# Patient Record
Sex: Male | Born: 2000 | Race: White | Hispanic: Yes | Marital: Single | Smoking: Never smoker
Health system: Southern US, Community
[De-identification: ages and names within clinical notes are randomized; demographics above are authoritative.]

## PROBLEM LIST (undated history)

## (undated) DIAGNOSIS — F419 Anxiety disorder, unspecified: Secondary | ICD-10-CM

## (undated) DIAGNOSIS — L309 Dermatitis, unspecified: Secondary | ICD-10-CM

## (undated) DIAGNOSIS — J45909 Unspecified asthma, uncomplicated: Secondary | ICD-10-CM

## (undated) HISTORY — DX: Anxiety disorder, unspecified: F41.9

## (undated) HISTORY — DX: Dermatitis, unspecified: L30.9

## (undated) HISTORY — DX: Unspecified asthma, uncomplicated: J45.909

---
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
## 2018-02-24 DIAGNOSIS — M545 Low back pain: Secondary | ICD-10-CM | POA: Diagnosis not present

## 2018-07-03 ENCOUNTER — Telehealth (INDEPENDENT_AMBULATORY_CARE_PROVIDER_SITE_OTHER): Payer: BLUE CROSS/BLUE SHIELD | Admitting: Family

## 2018-07-03 ENCOUNTER — Ambulatory Visit: Payer: Self-pay | Admitting: *Deleted

## 2018-07-03 DIAGNOSIS — J45909 Unspecified asthma, uncomplicated: Secondary | ICD-10-CM

## 2018-07-03 MED ORDER — FLUTICASONE PROPIONATE HFA 110 MCG/ACT IN AERO: 1.0000 | INHALATION_SPRAY | Freq: Two times a day (BID) | RESPIRATORY_TRACT | 3 refills | Status: AC

## 2018-07-03 MED ORDER — ALBUTEROL SULFATE HFA 108 (90 BASE) MCG/ACT IN AERS: 2.0000 | INHALATION_SPRAY | RESPIRATORY_TRACT | 3 refills | Status: AC | PRN

## 2018-07-03 NOTE — Progress Notes (Signed)
Virtual Visit via Video Note  I connected with Cleatis Clemenbt on 07/03/18 at 11:40 AM EDT by a video enabled telemedicine application and verified that I am speaking with the correct person using two identifiers. This visit type was conducted due to national recommendations for restrictions regarding the COVID-19 Pandemic (e.g. social distancing).  This format is felt to be most appropriate for this patient at this time.   I discussed the limitations of evaluation and management by telemedicine and the availability of in person appointments. The patient expressed understanding and agreed to proceed.  Only the patient and myself were on today's video visit. The patient was at home and I was in my office at the time of today's visit.   History of Present Illness:  Patient is a 18 yr old male who presents today with c/o it being "more difficult for me to breath."  Reports that like "I can breath but that I can't get enough oxygen." Felt spaced out.  Reports that symptoms are worst at night and have been present x 1 week.  Reports that he has also been having some intermittent sharp pains that radiate across his chest bilaterally. Denies fever or cough.  He reports mild nasal congestion.    Observations/Objective:  Gen: Awake, alert, no acute distress Resp: Breathing is even and non-labored Psych: calm/pleasant demeanor Neuro: Alert and Oriented x 3, + facial symmetry, speech is clear.   Assessment and Plan:  Asthma- uncontrolled. Will refill albuterol and add flovent bid. He is advised to continue flovent through the spring until pollen settles down and asthma symptoms are stable.   Follow Up Instructions:     I discussed the assessment and treatment plan with the patient. The patient was provided an opportunity to ask questions and all were answered. The patient agreed with the plan and demonstrated an understanding of the instructions.   The patient was advised to call back or seek an  in-person evaluation if the symptoms worsen or if the condition fails to improve as anticipated.    Lemont Fillers, NP

## 2018-07-03 NOTE — Telephone Encounter (Signed)
Appointment scheduled with M. Peggyann Juba. Per mother they would like to see male provider.

## 2018-07-03 NOTE — Telephone Encounter (Signed)
Patient is having exacerbation of asthma. Patient is requesting appointment- increased wheezing and needs inhaler. Mother is calling to report her son is having increased episodes of asthma without exercise.   Reason for Disposition . [1] MILD asthma attack (e.g., no SOB at rest, mild SOB with walking, speaks normally in sentences, mild wheezing) AND [2]  persists > 24 hours on appropriate treatment  Answer Assessment - Initial Assessment Questions 1. RESPIRATORY STATUS: "Describe your breathing?" (e.g., wheezing, shortness of breath, unable to speak, severe coughing)      Breath is shallow at times with wheezing. Patient has not been using inhaler with exercise- but now is needing it. Patient seems to be needing the inhaler at times other than exercise. Patient did have tingling of extermities 2. ONSET: "When did this asthma attack begin?"      1 week ago- breathing issues increased 3. TRIGGER: "What do you think triggered this attack?" (e.g., URI, exposure to pollen or other allergen, tobacco smoke)      Possible pollen- no fever or cough 4. PEAK EXPIRATORY FLOW RATE (PEFR): "Do you use a peak flow meter?" If so, ask: "What's the current peak flow? What's your personal best peak flow?"      no 5. SEVERITY: "How bad is this attack?"    - MILD: No SOB at rest, mild SOB with walking, speaks normally in sentences, can lay down, no retractions, pulse < 100. (GREEN Zone: PEFR 80-100%)   - MODERATE: SOB at rest, SOB with minimal exertion and prefers to sit, cannot lie down flat, speaks in phrases, mild retractions, audible wheezing, pulse 100-120. (YELLOW Zone: PEFR 50-80%)    - SEVERE: Very SOB at rest, speaks in single words, struggling to breathe, sitting hunched forward, retractions, usually loud wheezing, sometimes minimal wheezing because of decreased air movement, pulse > 120. (RED Zone: PEFR < 50%).      Symptoms had always been mild- these symptoms have comes on suddenly within the past  week 6. MEDICATIONS (Inhaler or nebs): "What are your asthma medications?" and "What treatments have you given so far?"    - Quick-relief: albuterol, metaproterenol, salbutamol, or other inhaled or nebulized beta-agonist medicines   - Long-term-control: steroids, cromolyn, or other anti-inflammatory medicines.     Patient does not have inhaler 7. OTHER SYMPTOMS: "Do you have any other symptoms? (e.g., runny nose, chest pain, fever)     no 8. PREGNANCY: "Is there any chance you are pregnant?" "When was your last menstrual period?"     n/a  Protocols used: ASTHMA ATTACK-A-AH

## 2018-09-19 ENCOUNTER — Telehealth: Payer: Self-pay

## 2018-09-19 ENCOUNTER — Ambulatory Visit: Payer: Self-pay | Admitting: Medical

## 2018-09-19 NOTE — Telephone Encounter (Signed)
Pt's mother called wanting pt to get covid testing due to exposure. Requested mother to tell pt to call back to be triaged. Mother verbalized understanding

## 2018-09-19 NOTE — Telephone Encounter (Signed)
Pt. Reports he was around his cousin whose parents tested positive for Covid 28.Pt. does not have symptoms. Per Marita [place] in the practice, pt. Can utilize community testing. Pt. Verbalizes understanding. Answer Assessment - Initial Assessment Questions 1. CLOSE CONTACT: "Who is the person with the confirmed or suspected COVID-19 infection that you were exposed to?"     No test 2. PLACE of CONTACT: "Where were you when you were exposed to COVID-19?" (e.g., home, school, medical waiting room; which city?)     Home 3. TYPE of CONTACT: "How much contact was there?" (e.g., sitting next to, live in same house, work in same office, same building)     Home 4. DURATION of CONTACT: "How long were you in contact with the COVID-19 patient?" (e.g., a few seconds, passed by person, a few minutes, live with the patient)     A few hours 5. DATE of CONTACT: "When did you have contact with a COVID-19 patient?" (e.g., how many days ago)     Last week 6. TRAVEL: "Have you traveled out of the country recently?" If so, "When and where?"     * Also ask about out-of-state travel, since the CDC has identified some high-risk cities for community spread in the [place].     * Note: Travel becomes less relevant if there is widespread community transmission where the patient lives.     No 7. COMMUNITY SPREAD: "Are there lots of cases of COVID-19 (community spread) where you live?" (See public health department website, if unsure)       Yes 8. SYMPTOMS: "Do you have any symptoms?" (e.g., fever, cough, breathing difficulty)     No 9. PREGNANCY OR POSTPARTUM: "Is there any chance you are pregnant?" "When was your last menstrual period?" "Did you deliver in the last 2 weeks?"     n/a 10. HIGH RISK: "Do you have any heart or lung problems? Do you have a weak immune system?" (e.g., CHF, COPD, asthma, HIV positive, chemotherapy, renal failure, diabetes mellitus, sickle cell anemia)       Exercise induced asthma  Protocols used:  CORONAVIRUS (COVID-19) EXPOSURE-A-AH

## 2018-09-21 DIAGNOSIS — F331 Major depressive disorder, recurrent, moderate: Secondary | ICD-10-CM | POA: Diagnosis not present

## 2018-09-21 DIAGNOSIS — F441 Dissociative fugue: Secondary | ICD-10-CM | POA: Diagnosis not present

## 2018-10-05 DIAGNOSIS — F331 Major depressive disorder, recurrent, moderate: Secondary | ICD-10-CM | POA: Diagnosis not present

## 2018-10-26 ENCOUNTER — Encounter: Payer: Self-pay | Admitting: Family Medicine

## 2018-10-26 ENCOUNTER — Other Ambulatory Visit: Payer: Self-pay

## 2018-10-26 ENCOUNTER — Ambulatory Visit: Payer: BC Managed Care – PPO | Admitting: Family Medicine

## 2018-10-26 ENCOUNTER — Other Ambulatory Visit (HOSPITAL_COMMUNITY)
Admission: RE | Admit: 2018-10-26 | Discharge: 2018-10-26 | Disposition: A | Discharge status: Home / Self Care | Payer: BC Managed Care – PPO | Source: Ambulatory Visit | Attending: Family Medicine | Admitting: Family Medicine

## 2018-10-26 VITALS — BP 120/60 | HR 95 | Temp 98.4°F | Resp 18 | Ht 74.0 in | Wt 164.4 lb

## 2018-10-26 DIAGNOSIS — Z7251 High risk heterosexual behavior: Secondary | ICD-10-CM

## 2018-10-26 DIAGNOSIS — R369 Urethral discharge, unspecified: Secondary | ICD-10-CM

## 2018-10-26 LAB — POC URINALSYSI DIPSTICK (AUTOMATED)
Bilirubin, UA: NEGATIVE
Blood, UA: NEGATIVE
Glucose, UA: NEGATIVE
Ketones, UA: NEGATIVE
Leukocytes, UA: NEGATIVE
Nitrite, UA: NEGATIVE
Protein, UA: NEGATIVE
Spec Grav, UA: 1.025 (ref 1.010–1.025)
Urobilinogen, UA: 0.2 E.U./dL
pH, UA: 6 (ref 5.0–8.0)

## 2018-10-26 MED ORDER — AZITHROMYCIN 250 MG PO TABS: ORAL_TABLET | ORAL | 0 refills | Status: DC

## 2018-10-26 MED ORDER — CEFTRIAXONE SODIUM 250 MG IJ SOLR
250.0000 mg | Freq: Once | INTRAMUSCULAR | Status: AC
  Administered 2018-10-26: 250 mg via INTRAMUSCULAR

## 2018-10-26 MED FILL — AZITHROMYCIN 250 MG TABS: 250 | 4 days supply | Qty: 4 | Fill #0

## 2018-10-26 NOTE — Patient Instructions (Signed)

## 2018-10-26 NOTE — Progress Notes (Signed)
Patient ID: William Melendez, male    DOB: 2001/01/10  Age: 18 y.o. MRN: [id number]    Subjective:  Subjective  HPI William Melendez presents for tenderness in testicles and penile d/c --- yellow x 1 week.  He has been having unprotected sex and is concerned.  No fevers no abd pain   Review of Systems  Constitutional: Negative for appetite change, diaphoresis, fatigue and unexpected weight change.  Eyes: Negative for pain, redness and visual disturbance.  Respiratory: Negative for cough, chest tightness, shortness of breath and wheezing.   Cardiovascular: Negative for chest pain, palpitations and leg swelling.  Endocrine: Negative for cold intolerance, heat intolerance, polydipsia, polyphagia and polyuria.  Genitourinary: Positive for discharge and testicular pain. Negative for difficulty urinating, dysuria, frequency and urgency.  Neurological: Negative for dizziness, light-headedness, numbness and headaches.    History Past Medical History:  Diagnosis Date  . Anxiety   . Asthma   . Eczema     He has no past surgical history on file.   His family history includes Arrhythmia in his maternal grandfather; Heart attack in his paternal grandmother and paternal uncle; Stroke in his mother.He reports that he has never smoked. He has never used smokeless tobacco. He reports that he does not drink alcohol or use drugs.  Current Outpatient Medications on File Prior to Visit  Medication Sig Dispense Refill  . albuterol (PROAIR HFA) 108 (90 Base) MCG/ACT inhaler Inhale 2 puffs into the lungs every 4 (four) hours as needed for wheezing or shortness of breath. 1 Inhaler 3  . cetirizine (ZYRTEC) 10 MG tablet Take 10 mg by mouth daily.    . flunisolide (NASALIDE) 25 MCG/ACT (0.025%) SOLN Place 1 spray into the nose 2 (two) times daily.    . fluticasone (FLOVENT HFA) 110 MCG/ACT inhaler Inhale 1 puff into the lungs 2 (two) times a day. 1 Inhaler 3  . triamcinolone (KENALOG) 0.025 % ointment Apply 1  application topically 2 (two) times daily.     No current facility-administered medications on file prior to visit.      Objective:  Objective  Physical Exam Vitals signs and nursing note reviewed.  Constitutional:      General: He is sleeping.     Appearance: He is well-developed.  HENT:     Head: Normocephalic and atraumatic.  Eyes:     Pupils: Pupils are equal, round, and reactive to light.  Neck:     Musculoskeletal: Normal range of motion and neck supple.     Thyroid: No thyromegaly.  Cardiovascular:     Rate and Rhythm: Normal rate and regular rhythm.     Heart sounds: No murmur.  Pulmonary:     Effort: Pulmonary effort is normal. No respiratory distress.     Breath sounds: Normal breath sounds. No wheezing or rales.  Chest:     Chest wall: No tenderness.  Abdominal:     General: There is no distension.     Palpations: Abdomen is soft. There is no mass.     Tenderness: There is no abdominal tenderness. There is no right CVA tenderness, left CVA tenderness, guarding or rebound.     Hernia: No hernia is present.  Genitourinary:    Penis: No erythema, discharge or lesions.      Scrotum/Testes: Normal.        Right: Mass or tenderness not present.        Left: Mass or tenderness not present.  Musculoskeletal:  General: No tenderness.  Skin:    General: Skin is warm and dry.  Neurological:     Mental Status: He is oriented to person, place, and time.  Psychiatric:        Behavior: Behavior normal.        Thought Content: Thought content normal.        Judgment: Judgment normal.    BP 120/60 (BP Location: Right Arm, Patient Position: Sitting, Cuff Size: Normal)   Pulse 95   Temp 98.4 F (36.9 C) (Temporal)   Resp 18   Ht 6\' 2"  (1.88 m)   Wt 164 lb 6.4 oz (74.6 kg)   SpO2 98%   BMI 21.11 kg/m  Wt Readings from Last 3 Encounters:  10/26/18 164 lb 6.4 oz (74.6 kg) (64 %, Z= 0.35)*  09/15/17 167 lb (75.8 kg) (72 %, Z= 0.59)*  02/24/17 170 lb 6.4 oz  (77.3 kg) (78 %, Z= 0.78)*   * Growth percentiles are based on CDC (Boys, 2-20 Years) data.     Lab Results  Component Value Date   WBC 7.5 01/06/2017   HGB 15.8 01/06/2017   HCT 45.2 01/06/2017   PLT 251.0 01/06/2017   GLUCOSE 89 01/06/2017   ALT 19 01/06/2017   AST 29 01/06/2017   NA 140 01/06/2017   K 4.0 01/06/2017   CL 102 01/06/2017   CREATININE 1.19 01/06/2017   BUN 18 01/06/2017   CO2 31 01/06/2017   TSH 2.14 01/06/2017    No results found.   Assessment & Plan:  Plan  I am having William Melendez start on azithromycin. I am also having him maintain his cetirizine, flunisolide, triamcinolone, albuterol, and fluticasone. We administered cefTRIAXone.  Meds ordered this encounter  Medications  . azithromycin (ZITHROMAX) 250 MG tablet    Sig: 4 po qd x1    Dispense:  4 tablet    Refill:  0  . cefTRIAXone (ROCEPHIN) injection 250 mg    Problem List Items Addressed This Visit    None    Visit Diagnoses    High risk sexual behavior, unspecified type    -  Primary   Relevant Medications   azithromycin (ZITHROMAX) 250 MG tablet   cefTRIAXone (ROCEPHIN) injection 250 mg (Completed)   Other Relevant Orders   Urine cytology ancillary only([facility]) (Completed)   HIV antibody (with reflex) (Completed)   RPR (Completed)   HSV Type I/II IgG, IgMw/ reflex (Completed)   Penile discharge       Relevant Medications   azithromycin (ZITHROMAX) 250 MG tablet   Other Relevant Orders   POCT Urinalysis Dipstick (Automated) (Completed)      Follow-up: Return if symptoms worsen or fail to improve.  Donato SchultzYvonne R Lowne Chase, DO

## 2018-10-27 ENCOUNTER — Encounter: Payer: Self-pay | Admitting: Family Medicine

## 2018-10-27 LAB — HSV TYPE I/II IGG, IGMW/ REFLEX
HSV 1 Glycoprotein G Ab, IgG: <0.91 index (ref 0.00–0.90)
HSV 1 IgM: <1:10 {titer}
HSV 2 IgG, Type Spec: <0.91 index (ref 0.00–0.90)
HSV 2 IgM: <1:10 {titer}

## 2018-10-27 LAB — RPR: RPR Ser Ql: NONREACTIVE

## 2018-10-27 LAB — HIV ANTIBODY (ROUTINE TESTING W REFLEX): HIV 1&2 Ab, 4th Generation: NONREACTIVE

## 2018-10-28 LAB — URINE CYTOLOGY ANCILLARY ONLY
Chlamydia: NEGATIVE
Neisseria Gonorrhea: NEGATIVE
Trichomonas: NEGATIVE

## 2018-10-30 LAB — URINE CYTOLOGY ANCILLARY ONLY
Bacterial vaginitis: NEGATIVE
Candida vaginitis: NEGATIVE

## 2019-09-21 ENCOUNTER — Encounter: Payer: Self-pay | Admitting: Internal Medicine

## 2019-09-21 ENCOUNTER — Encounter (HOSPITAL_BASED_OUTPATIENT_CLINIC_OR_DEPARTMENT_OTHER): Payer: Self-pay | Admitting: *Deleted

## 2019-09-21 ENCOUNTER — Other Ambulatory Visit: Payer: Self-pay

## 2019-09-21 ENCOUNTER — Ambulatory Visit: Payer: No Typology Code available for payment source | Admitting: Internal Medicine

## 2019-09-21 ENCOUNTER — Emergency Department (HOSPITAL_BASED_OUTPATIENT_CLINIC_OR_DEPARTMENT_OTHER)
Admission: EM | Admit: 2019-09-21 | Discharge: 2019-09-22 | Disposition: A | Discharge status: Home / Self Care | Payer: PRIVATE HEALTH INSURANCE | Attending: Emergency Medicine | Admitting: Emergency Medicine

## 2019-09-21 VITALS — BP 152/90 | HR 66 | Temp 98.0°F | Ht 72.9 in | Wt 178.0 lb

## 2019-09-21 DIAGNOSIS — J45909 Unspecified asthma, uncomplicated: Secondary | ICD-10-CM | POA: Insufficient documentation

## 2019-09-21 DIAGNOSIS — Z7951 Long term (current) use of inhaled steroids: Secondary | ICD-10-CM | POA: Diagnosis not present

## 2019-09-21 DIAGNOSIS — M79604 Pain in right leg: Secondary | ICD-10-CM

## 2019-09-21 LAB — COMPREHENSIVE METABOLIC PANEL
ALT: 36 U/L (ref 0–44)
AST: 59 U/L — ABNORMAL HIGH (ref 15–41)
Albumin: 4.9 g/dL (ref 3.5–5.0)
Alkaline Phosphatase: 60 U/L (ref 38–126)
Anion gap: 10 (ref 5–15)
BUN: 23 mg/dL — ABNORMAL HIGH (ref 6–20)
CO2: 26 mmol/L (ref 22–32)
Calcium: 9.1 mg/dL (ref 8.9–10.3)
Chloride: 102 mmol/L (ref 98–111)
Creatinine, Ser: 1.11 mg/dL (ref 0.61–1.24)
GFR calc Af Amer: >60 mL/min (ref >60)
GFR calc non Af Amer: >60 mL/min (ref >60)
Glucose, Bld: 107 mg/dL — ABNORMAL HIGH (ref 70–99)
Potassium: 4.2 mmol/L (ref 3.5–5.1)
Sodium: 138 mmol/L (ref 135–145)
Total Bilirubin: 1.2 mg/dL (ref 0.3–1.2)
Total Protein: 7.4 g/dL (ref 6.5–8.1)

## 2019-09-21 LAB — CBC WITH DIFFERENTIAL/PLATELET
Abs Immature Granulocytes: 0.03 10*3/uL (ref 0.00–0.07)
Basophils Absolute: 0.1 10*3/uL (ref 0.0–0.1)
Basophils Relative: 1 %
Eosinophils Absolute: 0.3 10*3/uL (ref 0.0–0.5)
Eosinophils Relative: 3 %
HCT: 44.4 % (ref 39.0–52.0)
Hemoglobin: 15.1 g/dL (ref 13.0–17.0)
Immature Granulocytes: 0 %
Lymphocytes Relative: 18 %
Lymphs Abs: 1.8 10*3/uL (ref 0.7–4.0)
MCH: 31.5 pg (ref 26.0–34.0)
MCHC: 34 g/dL (ref 30.0–36.0)
MCV: 92.5 fL (ref 80.0–100.0)
Monocytes Absolute: 0.8 10*3/uL (ref 0.1–1.0)
Monocytes Relative: 7 %
Neutro Abs: 7.5 10*3/uL (ref 1.7–7.7)
Neutrophils Relative %: 71 %
Platelets: 234 10*3/uL (ref 150–400)
RBC: 4.8 MIL/uL (ref 4.22–5.81)
RDW: 11.5 % (ref 11.5–15.5)
WBC: 10.5 10*3/uL (ref 4.0–10.5)
nRBC: 0 % (ref 0.0–0.2)

## 2019-09-21 LAB — URINALYSIS, ROUTINE W REFLEX MICROSCOPIC
Bilirubin Urine: NEGATIVE
Glucose, UA: NEGATIVE mg/dL
Hgb urine dipstick: NEGATIVE
Ketones, ur: NEGATIVE mg/dL
Leukocytes,Ua: NEGATIVE
Nitrite: NEGATIVE
Protein, ur: NEGATIVE mg/dL
Specific Gravity, Urine: 1.01 (ref 1.005–1.030)
pH: 6 (ref 5.0–8.0)

## 2019-09-21 NOTE — ED Triage Notes (Signed)
Pt c/o right groin pain which radiates down to right knee x 1 day , seen by PMD for same DX muscle strain

## 2019-09-21 NOTE — Progress Notes (Signed)
° °  Subjective:   Patient ID: William Melendez, male    DOB: 2000/05/25, 19 y.o.   MRN: [id number]  HPI The patient is a 19 YO man coming in for leg pain. Started around 4 am and initially he thought is was a muscle spasm or cramp. However stretching the leg did not help. Was in the right thigh region. Did play basketball this week and do leg workup but this is not outside of normal. No fouls, twists, falls, injuries during basketball. Having no pain now. Did take ibuprofen 2 pills which seemed to help the pain. Gradually it resolved and he was able to go back to sleep. Denies swelling, rash. Denies fevers or chills. Is fully vaccinated against covid-19. Overall it is improving.   Review of Systems  Constitutional: Negative.   HENT: Negative.   Eyes: Negative.   Respiratory: Negative for cough, chest tightness and shortness of breath.   Cardiovascular: Negative for chest pain, palpitations and leg swelling.  Gastrointestinal: Negative for abdominal distention, abdominal pain, constipation, diarrhea, nausea and vomiting.  Musculoskeletal: Positive for myalgias.  Skin: Negative.   Neurological: Negative.   Psychiatric/Behavioral: Negative.     Objective:  Physical Exam Constitutional:      Appearance: He is well-developed.  HENT:     Head: Normocephalic and atraumatic.  Cardiovascular:     Rate and Rhythm: Normal rate and regular rhythm.  Pulmonary:     Effort: Pulmonary effort is normal. No respiratory distress.     Breath sounds: Normal breath sounds. No wheezing or rales.  Abdominal:     General: Bowel sounds are normal. There is no distension.     Palpations: Abdomen is soft.     Tenderness: There is no abdominal tenderness. There is no rebound.  Musculoskeletal:     Cervical back: Normal range of motion.  Skin:    General: Skin is warm and dry.  Neurological:     Mental Status: He is alert and oriented to person, place, and time.     Coordination: Coordination normal.      Vitals:   09/21/19 1156  BP: (!) 152/90  Pulse: 66  Temp: 98 F (36.7 C)  TempSrc: Oral  SpO2: 98%  Weight: 178 lb (80.7 kg)  Height: 6' 0.9" (1.852 m)    This visit occurred during the SARS-CoV-2 public health emergency.  Safety protocols were in place, including screening questions prior to the visit, additional usage of staff PPE, and extensive cleaning of exam room while observing appropriate contact time as indicated for disinfecting solutions.   Assessment & Plan:  Visit time 15 minutes in face to face communication with patient and coordination of care, additional 5 minutes spent in record review, coordination or care, ordering tests, communicating/referring to other healthcare professionals, documenting in medical records all on the same day of the visit for total time 20 minutes spent on the visit.

## 2019-09-21 NOTE — Patient Instructions (Signed)
You can use ice on the leg or ibuprofen if the pain comes back.

## 2019-09-21 NOTE — Assessment & Plan Note (Signed)
Advised RICE and good hydration. Call back if problem returns or worsens.

## 2019-09-22 DIAGNOSIS — M79604 Pain in right leg: Secondary | ICD-10-CM | POA: Diagnosis not present

## 2019-09-22 DIAGNOSIS — M4319 Spondylolisthesis, multiple sites in spine: Secondary | ICD-10-CM | POA: Diagnosis not present

## 2019-09-22 DIAGNOSIS — M79651 Pain in right thigh: Secondary | ICD-10-CM | POA: Diagnosis not present

## 2019-09-22 DIAGNOSIS — R5381 Other malaise: Secondary | ICD-10-CM | POA: Diagnosis not present

## 2019-09-22 DIAGNOSIS — R52 Pain, unspecified: Secondary | ICD-10-CM | POA: Diagnosis not present

## 2019-09-22 DIAGNOSIS — R110 Nausea: Secondary | ICD-10-CM | POA: Diagnosis not present

## 2019-09-22 MED ORDER — NAPROXEN 250 MG PO TABS
500.0000 mg | ORAL_TABLET | Freq: Once | ORAL | Status: AC
  Administered 2019-09-22: 500 mg via ORAL
  Filled 2019-09-22: qty 2

## 2019-09-22 MED ORDER — NAPROXEN 500 MG PO TABS: ORAL_TABLET | ORAL | 0 refills | Status: AC

## 2019-09-22 NOTE — ED Provider Notes (Signed)
[facility] Provider Note: Lowella Dell, MD, FACEP  CSN: [id number] MRN: [id number] ARRIVAL: 09/21/19 at 2000 ROOM: [facility]02/[facility]02   CHIEF COMPLAINT  Groin Pain   HISTORY OF PRESENT ILLNESS  09/22/19 12:31 AM William Melendez is a 19 y.o. male who awakened yesterday morning about 4 AM with a pain in his right leg.  He describes the pain as feeling like a muscle spasm or ache.  It is located in his medial right leg with some involvement of the lateral right thigh.  It is not worse with movement or palpation.  It is not worse with movement of the back.  There is no associated numbness or weakness.  He does not recall any injury.  He was seen by his physician in the office yesterday and told this was likely a muscle strain.  He was advised to use ice and ibuprofen as needed.  He did not get adequate relief from ibuprofen and returns.  He rates his pain as a 6 out of 10.   Past Medical History:  Diagnosis Date  . Anxiety   . Asthma   . Eczema     History reviewed. No pertinent surgical history.  Family History  Problem Relation Age of Onset  . Stroke Mother   . Heart attack Paternal Uncle   . Arrhythmia Maternal Grandfather   . Heart attack Paternal Grandmother     Social History   Tobacco Use  . Smoking status: Never Smoker  . Smokeless tobacco: Never Used  Vaping Use  . Vaping Use: Never used  Substance Use Topics  . Alcohol use: No  . Drug use: No    Prior to Admission medications   Medication Sig Start Date End Date Taking? Authorizing Provider  albuterol (PROAIR HFA) 108 (90 Base) MCG/ACT inhaler Inhale 2 puffs into the lungs every 4 (four) hours as needed for wheezing or shortness of breath. 07/03/18   Sandford Craze, NP  cetirizine (ZYRTEC) 10 MG tablet Take 10 mg by mouth daily.    [provider]  fluticasone (FLOVENT HFA) 110 MCG/ACT inhaler Inhale 1 puff into the lungs 2 (two) times a day. 07/03/18   Sandford Craze, NP     Allergies Patient has no known allergies.   REVIEW OF SYSTEMS  Negative except as noted here or in the History of Present Illness.   PHYSICAL EXAMINATION  Initial Vital Signs Blood pressure 128/68, pulse 63, temperature 98.4 F (36.9 C), temperature source Oral, resp. rate 20, height 6\' 1"  (1.854 m), weight 80.7 kg, SpO2 98 %.  Examination General: Well-developed, well-nourished male in no acute distress; appearance consistent with age of record HENT: normocephalic; atraumatic Eyes: Normal appearance Neck: supple Heart: regular rate and rhythm Lungs: clear to auscultation bilaterally Abdomen: soft; nondistended; nontender; bowel sounds present Back: Nontender; negative straight leg raise bilaterally Extremities: No deformity; full range of motion; pulses normal; no erythema, warmth, tenderness or edema Neurologic: Awake, alert and oriented; motor function intact in all extremities and symmetric; no facial droop Skin: Warm and dry Psychiatric: Normal mood and affect   RESULTS  Summary of this visit's results, reviewed and interpreted by myself:   EKG Interpretation  Date/Time:    Ventricular Rate:    PR Interval:    QRS Duration:   QT Interval:    QTC Calculation:   R Axis:     Text Interpretation:        Laboratory Studies: Results for orders placed or performed during the hospital encounter  of 09/21/19 (from the past 24 hour(s))  CBC with Differential     Status: None   Collection Time: 09/21/19  8:11 PM  Result Value Ref Range   WBC 10.5 4.0 - 10.5 K/uL   RBC 4.80 4.22 - 5.81 MIL/uL   Hemoglobin 15.1 13.0 - 17.0 g/dL   HCT 26.9 39 - 52 %   MCV 92.5 80.0 - 100.0 fL   MCH 31.5 26.0 - 34.0 pg   MCHC 34.0 30.0 - 36.0 g/dL   RDW 48.5 46.2 - 70.3 %   Platelets 234 150 - 400 K/uL   nRBC 0.0 0.0 - 0.2 %   Neutrophils Relative % 71 %   Neutro Abs 7.5 1.7 - 7.7 K/uL   Lymphocytes Relative 18 %   Lymphs Abs 1.8 0.7 - 4.0 K/uL   Monocytes Relative 7 %    Monocytes Absolute 0.8 0 - 1 K/uL   Eosinophils Relative 3 %   Eosinophils Absolute 0.3 0 - 0 K/uL   Basophils Relative 1 %   Basophils Absolute 0.1 0 - 0 K/uL   Immature Granulocytes 0 %   Abs Immature Granulocytes 0.03 0.00 - 0.07 K/uL  Comprehensive metabolic panel     Status: Abnormal   Collection Time: 09/21/19  8:11 PM  Result Value Ref Range   Sodium 138 135 - 145 mmol/L   Potassium 4.2 3.5 - 5.1 mmol/L   Chloride 102 98 - 111 mmol/L   CO2 26 22 - 32 mmol/L   Glucose, Bld 107 (H) 70 - 99 mg/dL   BUN 23 (H) 6 - 20 mg/dL   Creatinine, Ser 5.00 0.61 - 1.24 mg/dL   Calcium 9.1 8.9 - 93.8 mg/dL   Total Protein 7.4 6.5 - 8.1 g/dL   Albumin 4.9 3.5 - 5.0 g/dL   AST 59 (H) 15 - 41 U/L   ALT 36 0 - 44 U/L   Alkaline Phosphatase 60 38 - 126 U/L   Total Bilirubin 1.2 0.3 - 1.2 mg/dL   GFR calc non Af Amer >60 >60 mL/min   GFR calc Af Amer >60 >60 mL/min   Anion gap 10 5 - 15  Urinalysis, Routine w reflex microscopic     Status: Abnormal   Collection Time: 09/21/19  8:11 PM  Result Value Ref Range   Color, Urine STRAW (A) YELLOW   APPearance CLEAR CLEAR   Specific Gravity, Urine 1.010 1.005 - 1.030   pH 6.0 5.0 - 8.0   Glucose, UA NEGATIVE NEGATIVE mg/dL   Hgb urine dipstick NEGATIVE NEGATIVE   Bilirubin Urine NEGATIVE NEGATIVE   Ketones, ur NEGATIVE NEGATIVE mg/dL   Protein, ur NEGATIVE NEGATIVE mg/dL   Nitrite NEGATIVE NEGATIVE   Leukocytes,Ua NEGATIVE NEGATIVE   Imaging Studies: No results found.  ED COURSE and MDM  Nursing notes, initial and subsequent vitals signs, including pulse oximetry, reviewed and interpreted by myself.  Vitals:   09/21/19 2005 09/21/19 2007 09/21/19 2321 09/21/19 2346  BP:  (!) 156/76 140/68 128/68  Pulse:  80 85 63  Resp:  18  20  Temp:  98.4 F (36.9 C)    TempSrc:  Oral    SpO2:  98% 94% 98%  Weight: 80.7 kg     Height: 6\' 1"  (1.854 m)      Medications  naproxen (NAPROSYN) tablet 500 mg (has no administration in time range)     The patient's pain is not reproducible with palpation or movement of the lower extremity nor  with movement of the back.  He does have a history of spondylolisthesis.  This could represent pain in about the L3 dermatome although the L3 dermatome should not involve the medial thigh.  There is no evidence of any thrombophlebitis of the great saphenous vein on exam.  We will place him on naproxen.  PROCEDURES  Procedures   ED DIAGNOSES     ICD-10-CM   1. Pain in right leg  M79.604        Mandee Pluta, Jonny Ruiz, MD 09/22/19 [phone]

## 2019-09-22 NOTE — ED Notes (Signed)
EDP at bedside; witnessed exam

## 2020-04-30 ENCOUNTER — Encounter: Payer: Self-pay | Admitting: Internal Medicine

## 2020-04-30 ENCOUNTER — Ambulatory Visit (INDEPENDENT_AMBULATORY_CARE_PROVIDER_SITE_OTHER): Payer: PRIVATE HEALTH INSURANCE

## 2020-04-30 ENCOUNTER — Ambulatory Visit: Payer: PRIVATE HEALTH INSURANCE | Admitting: Internal Medicine

## 2020-04-30 ENCOUNTER — Other Ambulatory Visit: Payer: Self-pay

## 2020-04-30 VITALS — BP 112/60 | HR 55 | Temp 97.8°F | Resp 18 | Ht 73.0 in | Wt 176.4 lb

## 2020-04-30 DIAGNOSIS — L659 Nonscarring hair loss, unspecified: Secondary | ICD-10-CM | POA: Diagnosis not present

## 2020-04-30 DIAGNOSIS — M79641 Pain in right hand: Secondary | ICD-10-CM

## 2020-04-30 LAB — CBC
HCT: 42.7 % (ref 39.0–52.0)
Hemoglobin: 14.9 g/dL (ref 13.0–17.0)
MCHC: 34.8 g/dL (ref 30.0–36.0)
MCV: 91.3 fl (ref 78.0–100.0)
Platelets: 208 10*3/uL (ref 150.0–400.0)
RBC: 4.68 Mil/uL (ref 4.22–5.81)
RDW: 12 % (ref 11.5–15.5)
WBC: 6 10*3/uL (ref 4.0–10.5)

## 2020-04-30 LAB — LIPID PANEL
Cholesterol: 165 mg/dL (ref 0–200)
HDL: 58.3 mg/dL (ref >39.00)
LDL Cholesterol: 90 mg/dL (ref 0–99)
NonHDL: 107.01
Total CHOL/HDL Ratio: 3
Triglycerides: 84 mg/dL (ref 0.0–149.0)
VLDL: 16.8 mg/dL (ref 0.0–40.0)

## 2020-04-30 LAB — COMPREHENSIVE METABOLIC PANEL
ALT: 15 U/L (ref 0–53)
AST: 17 U/L (ref 0–37)
Albumin: 4.4 g/dL (ref 3.5–5.2)
Alkaline Phosphatase: 51 U/L (ref 39–117)
BUN: 22 mg/dL (ref 6–23)
CO2: 30 mEq/L (ref 19–32)
Calcium: 9.2 mg/dL (ref 8.4–10.5)
Chloride: 103 mEq/L (ref 96–112)
Creatinine, Ser: 1.16 mg/dL (ref 0.40–1.50)
GFR: 90.15 mL/min (ref >60.00)
Glucose, Bld: 99 mg/dL (ref 70–99)
Potassium: 3.9 mEq/L (ref 3.5–5.1)
Sodium: 139 mEq/L (ref 135–145)
Total Bilirubin: 0.6 mg/dL (ref 0.2–1.2)
Total Protein: 6.4 g/dL (ref 6.0–8.3)

## 2020-04-30 LAB — TSH: TSH: 3.21 u[IU]/mL (ref 0.35–4.50)

## 2020-04-30 LAB — VITAMIN B12: Vitamin B-12: 238 pg/mL (ref 211–911)

## 2020-04-30 LAB — VITAMIN D 25 HYDROXY (VIT D DEFICIENCY, FRACTURES): VITD: 25.47 ng/mL — ABNORMAL LOW (ref 30.00–100.00)

## 2020-04-30 NOTE — Progress Notes (Unsigned)
   Subjective:   Patient ID: William Melendez, male    DOB: 02-Jul-2000, 20 y.o.   MRN: [id number]  HPI The patient is a 20 YO man coming in for concerns about right hand pain (hit his hand on something back in October 2021, initially was swollen and very painful, used otc medications, did not get checked out, still with some difference in the hand, still with soreness and some stiffness, denies taking any medication for it currently) and hair thinning (started some time ago, gradual, not coming out in clumps, no bald patches).    Review of Systems  Constitutional: Negative.   HENT: Negative.   Eyes: Negative.   Respiratory: Negative for cough, chest tightness and shortness of breath.   Cardiovascular: Negative for chest pain, palpitations and leg swelling.  Gastrointestinal: Negative for abdominal distention, abdominal pain, constipation, diarrhea, nausea and vomiting.  Endocrine:       Hair thinning  Musculoskeletal: Positive for arthralgias and myalgias.  Skin: Negative.   Neurological: Negative.   Psychiatric/Behavioral: Negative.     Objective:  Physical Exam Constitutional:      Appearance: He is well-developed and well-nourished.  HENT:     Head: Normocephalic and atraumatic.  Eyes:     Extraocular Movements: EOM normal.  Cardiovascular:     Rate and Rhythm: Normal rate and regular rhythm.  Pulmonary:     Effort: Pulmonary effort is normal. No respiratory distress.     Breath sounds: Normal breath sounds. No wheezing or rales.  Abdominal:     General: Bowel sounds are normal. There is no distension.     Palpations: Abdomen is soft.     Tenderness: There is no abdominal tenderness. There is no rebound.  Musculoskeletal:        General: Tenderness present. No edema.     Cervical back: Normal range of motion.     Comments: Soreness right ulnar region to palpation, full ROM  Skin:    General: Skin is warm and dry.  Neurological:     Mental Status: He is alert and oriented  to person, place, and time.     Coordination: Coordination normal.  Psychiatric:        Mood and Affect: Mood and affect normal.     Vitals:   04/30/20 0842  BP: 112/60  Pulse: (!) 55  Resp: 18  Temp: 97.8 F (36.6 C)  TempSrc: Oral  SpO2: 99%  Weight: 176 lb 6.4 oz (80 kg)  Height: 6\' 1"  (1.854 m)    This visit occurred during the SARS-CoV-2 public health emergency.  Safety protocols were in place, including screening questions prior to the visit, additional usage of staff PPE, and extensive cleaning of exam room while observing appropriate contact time as indicated for disinfecting solutions.   Assessment & Plan:  Visit time 20 minutes in face to face communication with patient and coordination of care, additional 10 minutes spent in record review, coordination or care, ordering tests, communicating/referring to other healthcare professionals, documenting in medical records all on the same day of the visit for total time 30 minutes spent on the visit.

## 2020-04-30 NOTE — Patient Instructions (Signed)
We will do an x-ray of the hand today.

## 2020-05-01 DIAGNOSIS — L659 Nonscarring hair loss, unspecified: Secondary | ICD-10-CM | POA: Insufficient documentation

## 2020-05-01 DIAGNOSIS — M79641 Pain in right hand: Secondary | ICD-10-CM | POA: Insufficient documentation

## 2020-05-01 NOTE — Assessment & Plan Note (Signed)
Checking labs for CBC, CMP, thyroid, vitamin D/B12. If normal will advise biotin otc.

## 2020-05-01 NOTE — Assessment & Plan Note (Signed)
Checking x-ray. Possible he had fracture back in October which is now healed. We did discuss after a fracture this can cause pain for first few years especially in cold weather.

## 2021-07-03 IMAGING — DX DG HAND COMPLETE 3+V*R*
3 series · 3 of 3 positions shown · non-contrast
Comparison: None

CLINICAL DATA: RIGHT hand pain since [REDACTED] mainly in the fifth
digit.

EXAM:
RIGHT HAND - COMPLETE 3+ VIEW

[hand ap]
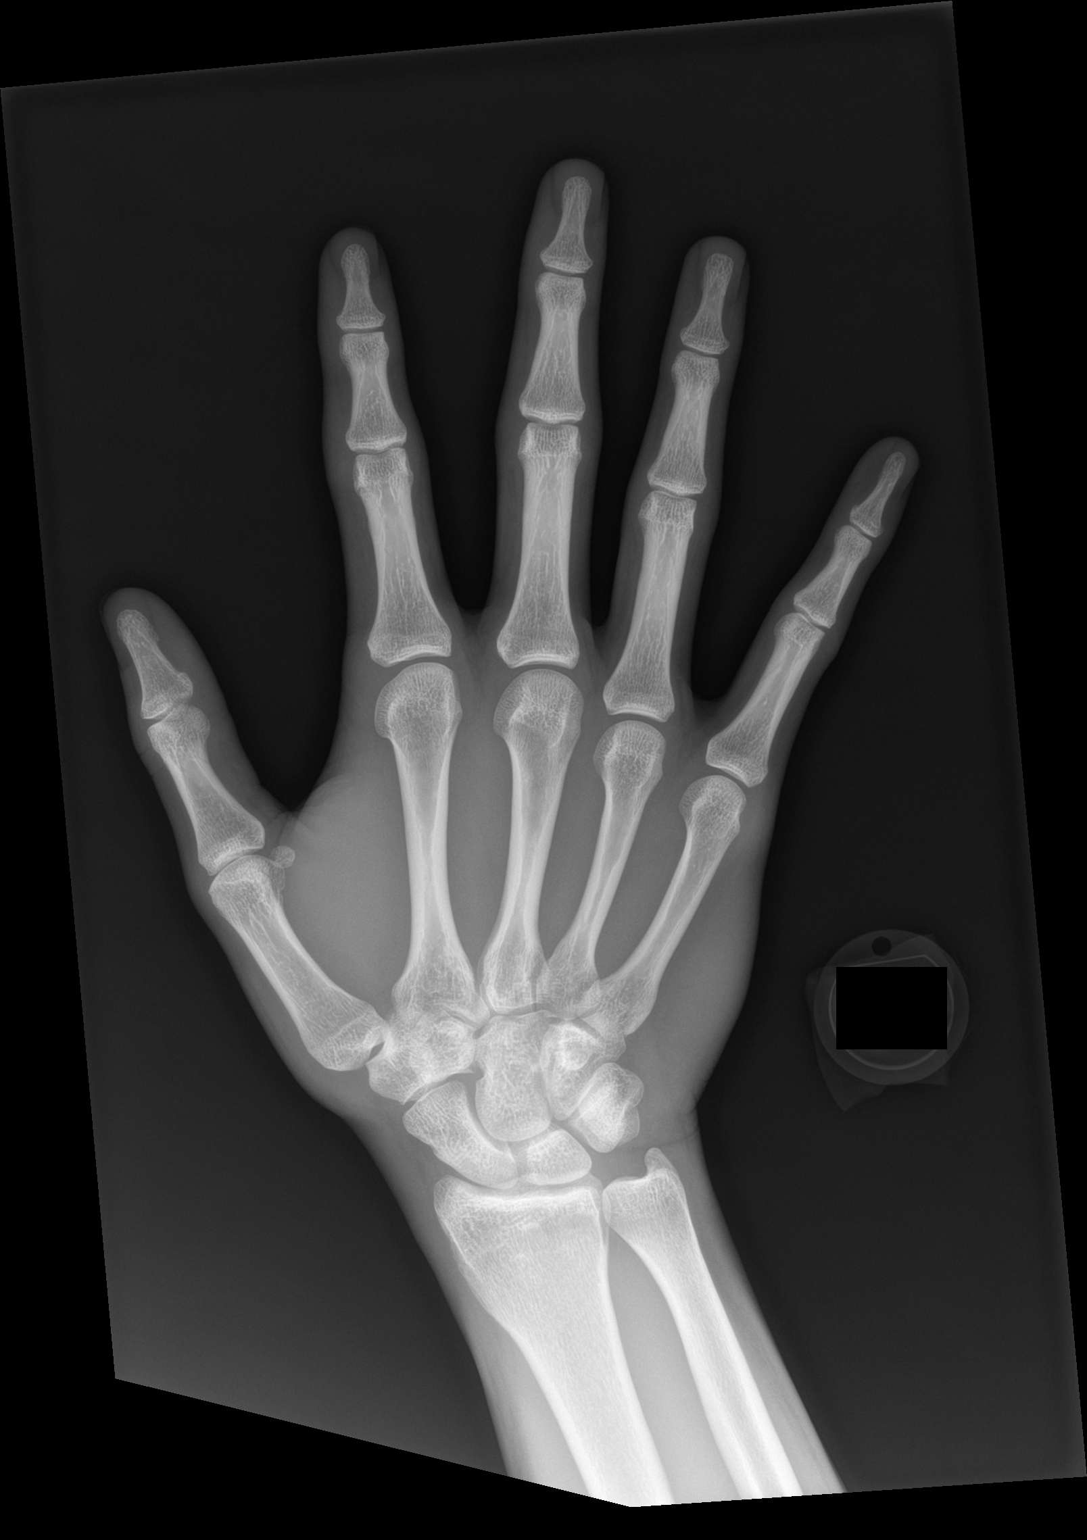

[hand obl]
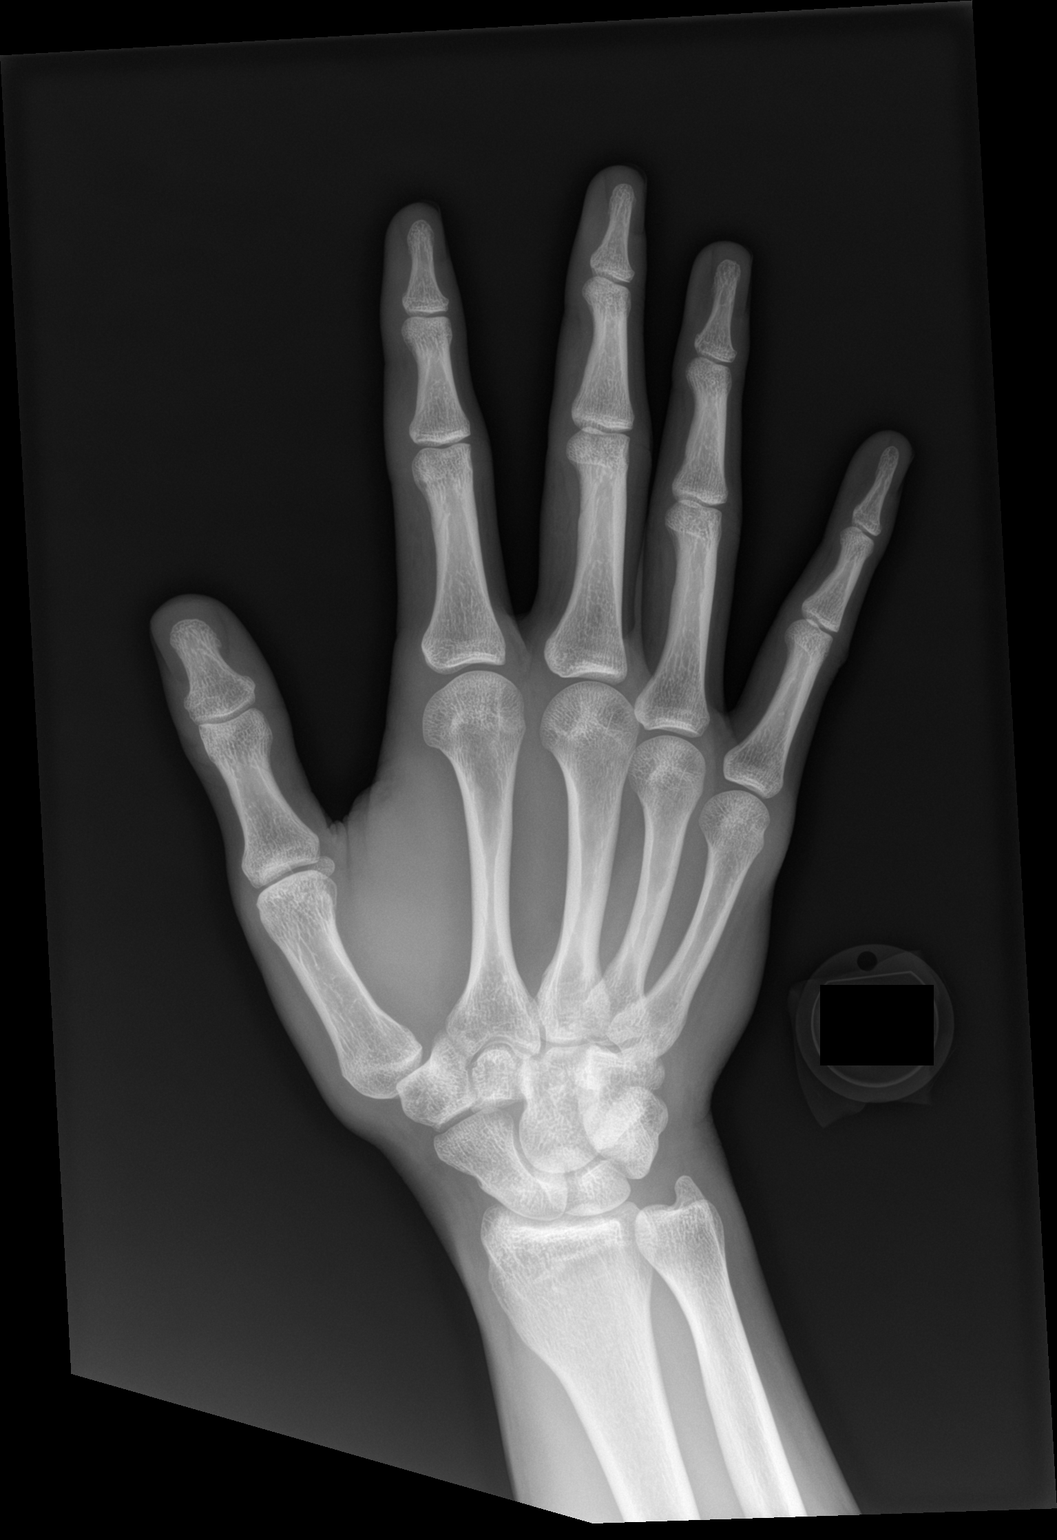

[hand lat]
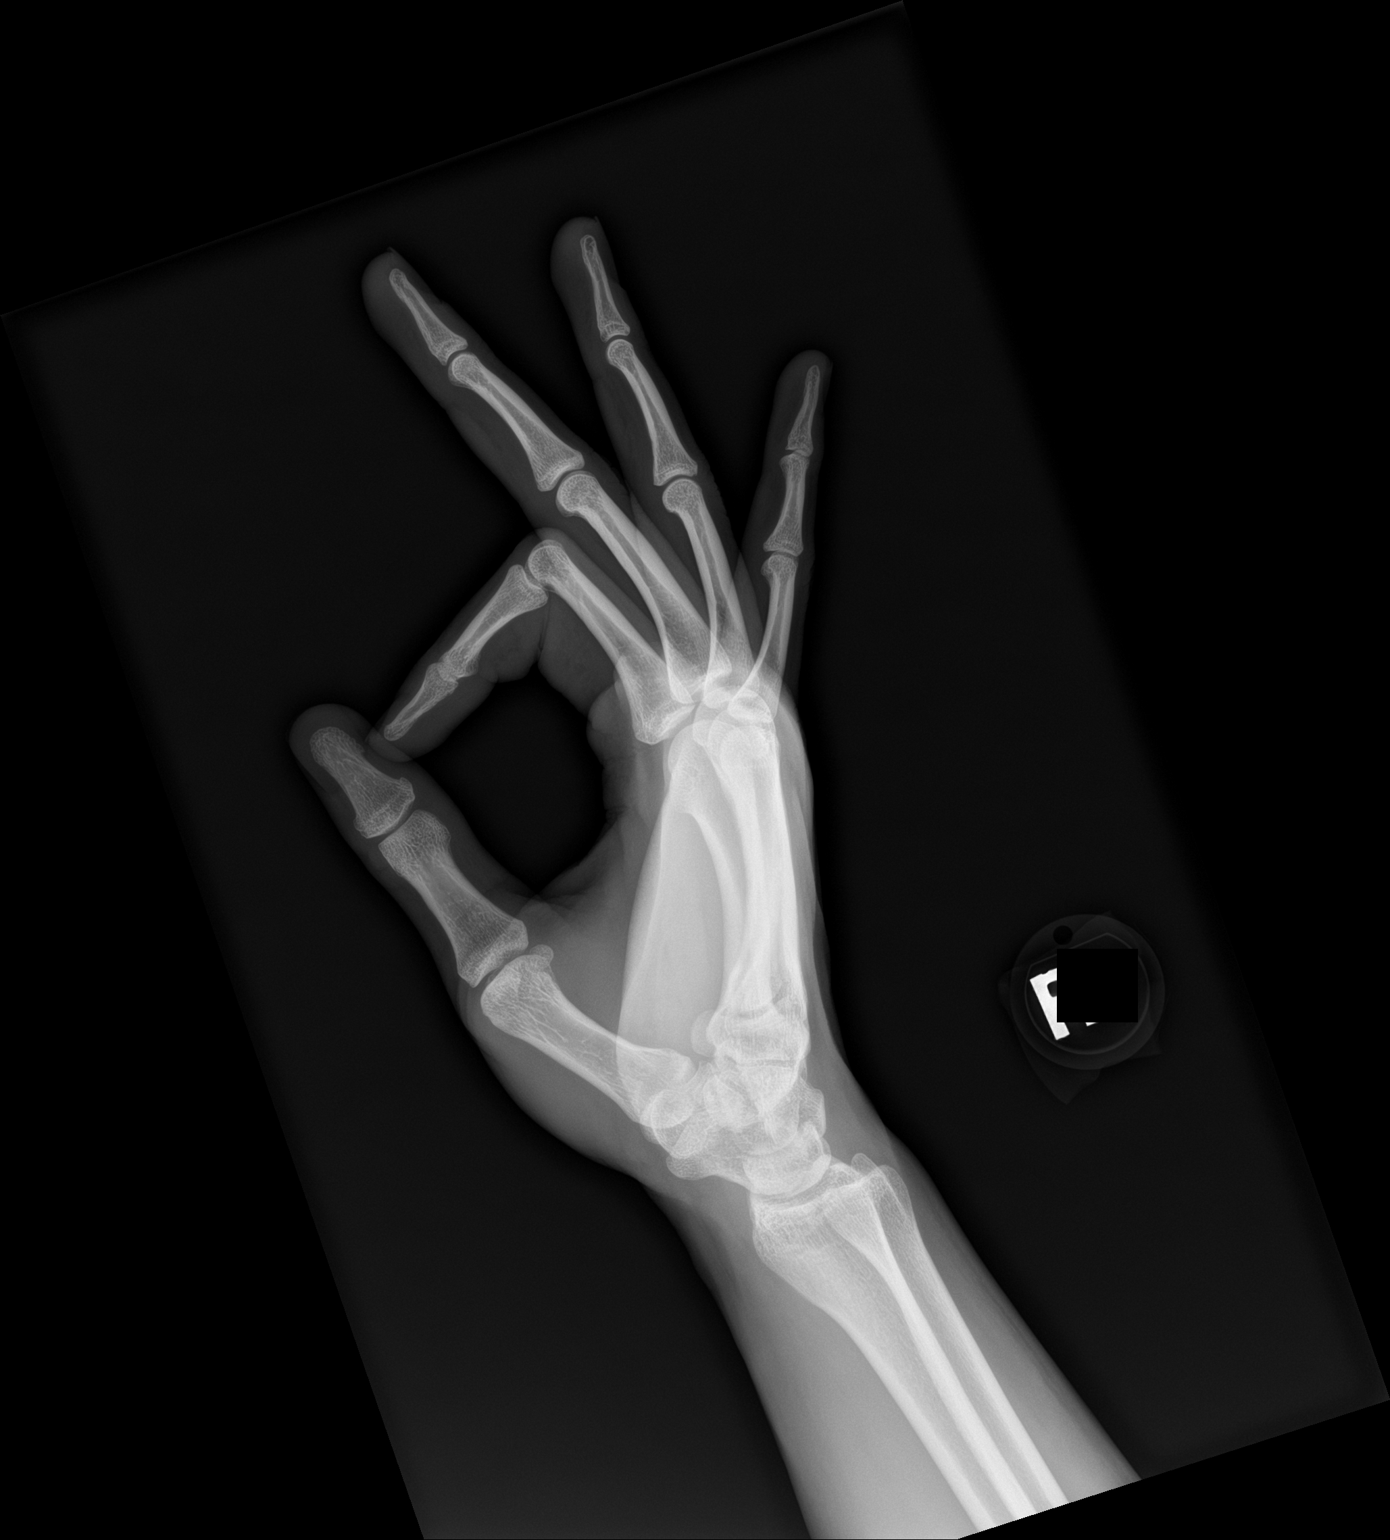

[3 of 3 positions shown; findings below may reference images not displayed]

FINDINGS: There is no evidence of fracture or dislocation. There is no
evidence of arthropathy or other focal bone abnormality. Soft
tissues are unremarkable.
IMPRESSION: Negative evaluation of the RIGHT hand.
